# Patient Record
Sex: Male | Born: 1985 | Race: White | Hispanic: No | Marital: Single | Smoking: Current every day smoker
Health system: Southern US, Community
[De-identification: ages and names within clinical notes are randomized; demographics above are authoritative.]

## PROBLEM LIST (undated history)

## (undated) DIAGNOSIS — R569 Unspecified convulsions: Secondary | ICD-10-CM

## (undated) DIAGNOSIS — J45909 Unspecified asthma, uncomplicated: Secondary | ICD-10-CM

## (undated) DIAGNOSIS — R011 Cardiac murmur, unspecified: Secondary | ICD-10-CM

## (undated) DIAGNOSIS — G43909 Migraine, unspecified, not intractable, without status migrainosus: Secondary | ICD-10-CM

---

## 2015-06-25 ENCOUNTER — Encounter (HOSPITAL_BASED_OUTPATIENT_CLINIC_OR_DEPARTMENT_OTHER): Payer: Self-pay

## 2015-06-25 ENCOUNTER — Emergency Department (HOSPITAL_BASED_OUTPATIENT_CLINIC_OR_DEPARTMENT_OTHER)
Admission: EM | Admit: 2015-06-25 | Discharge: 2015-06-25 | Disposition: A | Discharge status: Home / Self Care | Payer: Self-pay | Attending: Emergency Medicine | Admitting: Emergency Medicine

## 2015-06-25 ENCOUNTER — Emergency Department (HOSPITAL_BASED_OUTPATIENT_CLINIC_OR_DEPARTMENT_OTHER): Payer: Self-pay

## 2015-06-25 DIAGNOSIS — J45901 Unspecified asthma with (acute) exacerbation: Secondary | ICD-10-CM | POA: Insufficient documentation

## 2015-06-25 DIAGNOSIS — Z8679 Personal history of other diseases of the circulatory system: Secondary | ICD-10-CM | POA: Insufficient documentation

## 2015-06-25 DIAGNOSIS — F17200 Nicotine dependence, unspecified, uncomplicated: Secondary | ICD-10-CM | POA: Insufficient documentation

## 2015-06-25 DIAGNOSIS — R0789 Other chest pain: Secondary | ICD-10-CM | POA: Insufficient documentation

## 2015-06-25 DIAGNOSIS — R011 Cardiac murmur, unspecified: Secondary | ICD-10-CM | POA: Insufficient documentation

## 2015-06-25 HISTORY — DX: Unspecified convulsions: R56.9

## 2015-06-25 HISTORY — DX: Cardiac murmur, unspecified: R01.1

## 2015-06-25 HISTORY — DX: Unspecified asthma, uncomplicated: J45.909

## 2015-06-25 HISTORY — DX: Migraine, unspecified, not intractable, without status migrainosus: G43.909

## 2015-06-25 NOTE — ED Notes (Signed)
MD at bedside. 

## 2015-06-25 NOTE — ED Provider Notes (Signed)
CSN: [id number]     Arrival date & time 06/25/15  1236 History   First MD Initiated Contact with Patient 06/25/15 1345     Chief Complaint  Patient presents with  . Chest Pain     Patient is a 30 y.o. male presenting with chest pain. The history is provided by the patient. No language interpreter was used.  Chest Pain  Gwyneth RevelsJustin Scarpelli is a 30 y.o. male who presents to the Emergency Department complaining of chest pain.  He reports 2 weeks of constant left upper chest/shoulder pain that is described as a discomfort. He also has shortness of breath was tying his shoes and activity. He has a history of asthma and is a smoker. He did use cocaine 7 or 8 months ago and none recently. Is any fever, cough, abdominal pain, vomiting, leg swelling. His father has a history of cardiac disease in his mid 5660s. 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 Symptoms are moderate, constant.  Past Medical History  Diagnosis Date  . Asthma   . Heart murmur   . Seizures (HCC)   . Migraine    History reviewed. No pertinent past surgical history. No family history on file. Social History  Substance Use Topics  . Smoking status: Current Every Day Smoker  . Smokeless tobacco: None  . Alcohol Use: Yes     Comment: occ    Review of Systems  Cardiovascular: Positive for chest pain.  All other systems reviewed and are negative.     Allergies  Review of patient's allergies indicates no known allergies.  Home Medications   Prior to Admission medications   Not on File   BP 122/77 mmHg  Pulse 88  Temp(Src) 98.6 F (37 C) (Oral)  Resp 20  Ht 6\' 2"  (1.88 m)  Wt 230 lb (104.327 kg)  BMI 29.52 kg/m2  SpO2 100% Physical Exam  Constitutional: He is oriented to person, place, and time. He appears well-developed and well-nourished.  HENT:  Head: Normocephalic and atraumatic.  Cardiovascular: Normal rate and regular rhythm.   No murmur heard. Pulmonary/Chest: Effort normal and breath sounds normal. No  respiratory distress.  Left upper chest wall tenderness to palpation.    Abdominal: Soft. There is no tenderness. There is no rebound and no guarding.  Musculoskeletal: He exhibits no edema or tenderness.  Neurological: He is alert and oriented to person, place, and time.  Skin: Skin is warm and dry.  Psychiatric: He has a normal mood and affect. His behavior is normal.  Nursing note and vitals reviewed.   ED Course  Procedures (including critical care time) Labs Review Labs Reviewed - No data to display  Imaging Review Dg Chest 2 View  06/25/2015  CLINICAL DATA:  Chest pain for 2 weeks EXAM: CHEST  2 VIEW COMPARISON:  None. FINDINGS: Lungs are clear. Heart size and pulmonary vascularity are normal. No adenopathy. No bone lesions. No pneumothorax. IMPRESSION: No edema or consolidation. Electronically Signed   By: Bretta BangWilliam  Woodruff III M.D.   On: 06/25/2015 14:19   I have personally reviewed and evaluated these images and lab results as part of my medical decision-making.   EKG Interpretation   Date/Time:  Friday June 25 2015 12:45:07 EDT Ventricular Rate:  54 PR Interval:  178 QRS Duration: 100 QT Interval:  402 QTC Calculation: 381 R Axis:   87 Text Interpretation:  Sinus bradycardia Otherwise normal ECG Confirmed by  Lincoln Brighamees, Liz [phone]([id number]) on 06/25/2015 1:09:50 PM Also confirmed  by Lincoln Brigham  [phone]), editor Dan Humphreys, CCT, SANDRA [phone])  on 06/25/2015 1:49:43 PM      MDM   Final diagnoses:  Chest wall pain  Patient here for evaluation of 2 weeks of constant left-sided chest pain at this reproducible on examination. Presentation is not consistent with ACS, PE, dissection, pericarditis. Offered patient reassurance and discussed home care for muscular skeletal chest pain with rest, NSAIDs, Ice/Heat. Discussed outpatient follow-up and return precautions.    Tilden Fossa, MD 06/26/15 [phone]

## 2015-06-25 NOTE — ED Notes (Signed)
C/o chest pressure x 2 weeks-hx of cocaine use-none x 8 months-NAD-steady gait

## 2015-06-25 NOTE — Discharge Instructions (Signed)
You can take ibuprofen, available over the counter as needed for chest pain.     Chest Wall Pain Chest wall pain is pain in or around the bones and muscles of your chest. Sometimes, an injury causes this pain. Sometimes, the cause may not be known. This pain may take several weeks or longer to get better. HOME CARE INSTRUCTIONS  Pay attention to any changes in your symptoms. Take these actions to help with your pain:   Rest as told by your health care provider.   Avoid activities that cause pain. These include any activities that use your chest muscles or your abdominal and side muscles to lift heavy items.   If directed, apply ice to the painful area:  Put ice in a plastic bag.  Place a towel between your skin and the bag.  Leave the ice on for 20 minutes, 2-3 times per day.  Take over-the-counter and prescription medicines only as told by your health care provider.  Do not use tobacco products, including cigarettes, chewing tobacco, and e-cigarettes. If you need help quitting, ask your health care provider.  Keep all follow-up visits as told by your health care provider. This is important. SEEK MEDICAL CARE IF:  You have a fever.  Your chest pain becomes worse.  You have new symptoms. SEEK IMMEDIATE MEDICAL CARE IF:  You have nausea or vomiting.  You feel sweaty or light-headed.  You have a cough with phlegm (sputum) or you cough up blood.  You develop shortness of breath.   This information is not intended to replace advice given to you by your health care provider. Make sure you discuss any questions you have with your health care provider.   Document Released: 02/27/2005 Document Revised: 11/18/2014 Document Reviewed: 05/25/2014 Elsevier Interactive Patient Education 2016 [employer]. Smoking Cessation, Tips for Success If you are ready to quit smoking, congratulations! You have chosen to help yourself be healthier. Cigarettes bring nicotine, tar, carbon  monoxide, and other irritants into your body. Your lungs, heart, and blood vessels will be able to work better without these poisons. There are many different ways to quit smoking. Nicotine gum, nicotine patches, a nicotine inhaler, or nicotine nasal spray can help with physical craving. Hypnosis, support groups, and medicines help break the habit of smoking. WHAT THINGS CAN I DO TO MAKE QUITTING EASIER?  Here are some tips to help you quit for good:  Pick a date when you will quit smoking completely. Tell all of your friends and family about your plan to quit on that date.  Do not try to slowly cut down on the number of cigarettes you are smoking. Pick a quit date and quit smoking completely starting on that day.  Throw away all cigarettes.   Clean and remove all ashtrays from your home, work, and car.  On a card, write down your reasons for quitting. Carry the card with you and read it when you get the urge to smoke.  Cleanse your body of nicotine. Drink enough water and fluids to keep your urine clear or pale yellow. Do this after quitting to flush the nicotine from your body.  Learn to predict your moods. Do not let a bad situation be your excuse to have a cigarette. Some situations in your life might tempt you into wanting a cigarette.  Never have "just one" cigarette. It leads to wanting another and another. Remind yourself of your decision to quit.  Change habits associated with smoking. If you smoked  while driving or when feeling stressed, try other activities to replace smoking. Stand up when drinking your coffee. Brush your teeth after eating. Sit in a different chair when you read the paper. Avoid alcohol while trying to quit, and try to drink fewer caffeinated beverages. Alcohol and caffeine may urge you to smoke.  Avoid foods and drinks that can trigger a desire to smoke, such as sugary or spicy foods and alcohol.  Ask people who smoke not to smoke around you.  Have something  planned to do right after eating or having a cup of coffee. For example, plan to take a walk or exercise.  Try a relaxation exercise to calm you down and decrease your stress. Remember, you may be tense and nervous for the first 2 weeks after you quit, but this will pass.  Find new activities to keep your hands busy. Play with a pen, coin, or rubber band. Doodle or draw things on paper.  Brush your teeth right after eating. This will help cut down on the craving for the taste of tobacco after meals. You can also try mouthwash.   Use oral substitutes in place of cigarettes. Try using lemon drops, carrots, cinnamon sticks, or chewing gum. Keep them handy so they are available when you have the urge to smoke.  When you have the urge to smoke, try deep breathing.  Designate your home as a nonsmoking area.  If you are a heavy smoker, ask your health care provider about a prescription for nicotine chewing gum. It can ease your withdrawal from nicotine.  Reward yourself. Set aside the cigarette money you save and buy yourself something nice.  Look for support from others. Join a support group or smoking cessation program. Ask someone at home or at work to help you with your plan to quit smoking.  Always ask yourself, "Do I need this cigarette or is this just a reflex?" Tell yourself, "Today, I choose not to smoke," or "I do not want to smoke." You are reminding yourself of your decision to quit.  Do not replace cigarette smoking with electronic cigarettes (commonly called e-cigarettes). The safety of e-cigarettes is unknown, and some may contain harmful chemicals.  If you relapse, do not give up! Plan ahead and think about what you will do the next time you get the urge to smoke. HOW WILL I FEEL WHEN I QUIT SMOKING? You may have symptoms of withdrawal because your body is used to nicotine (the addictive substance in cigarettes). You may crave cigarettes, be irritable, feel very hungry, cough  often, get headaches, or have difficulty concentrating. The withdrawal symptoms are only temporary. They are strongest when you first quit but will go away within 10-14 days. When withdrawal symptoms occur, stay in control. Think about your reasons for quitting. Remind yourself that these are signs that your body is healing and getting used to being without cigarettes. Remember that withdrawal symptoms are easier to treat than the major diseases that smoking can cause.  Even after the withdrawal is over, expect periodic urges to smoke. However, these cravings are generally short lived and will go away whether you smoke or not. Do not smoke! WHAT RESOURCES ARE AVAILABLE TO HELP ME QUIT SMOKING? Your health care provider can direct you to community resources or hospitals for support, which may include:  Group support.  Education.  Hypnosis.  Therapy.   This information is not intended to replace advice given to you by your health care provider. Make sure  you discuss any questions you have with your health care provider.   Document Released: 11/26/2003 Document Revised: 03/20/2014 Document Reviewed: 08/15/2012 Elsevier Interactive Patient Education [employer]

## 2017-09-02 IMAGING — CR DG CHEST 2V
2 series · 2 of 2 positions shown · non-contrast
Comparison: None.

CLINICAL DATA: Chest pain for 2 weeks

EXAM:
CHEST  2 VIEW

[w chest pa]
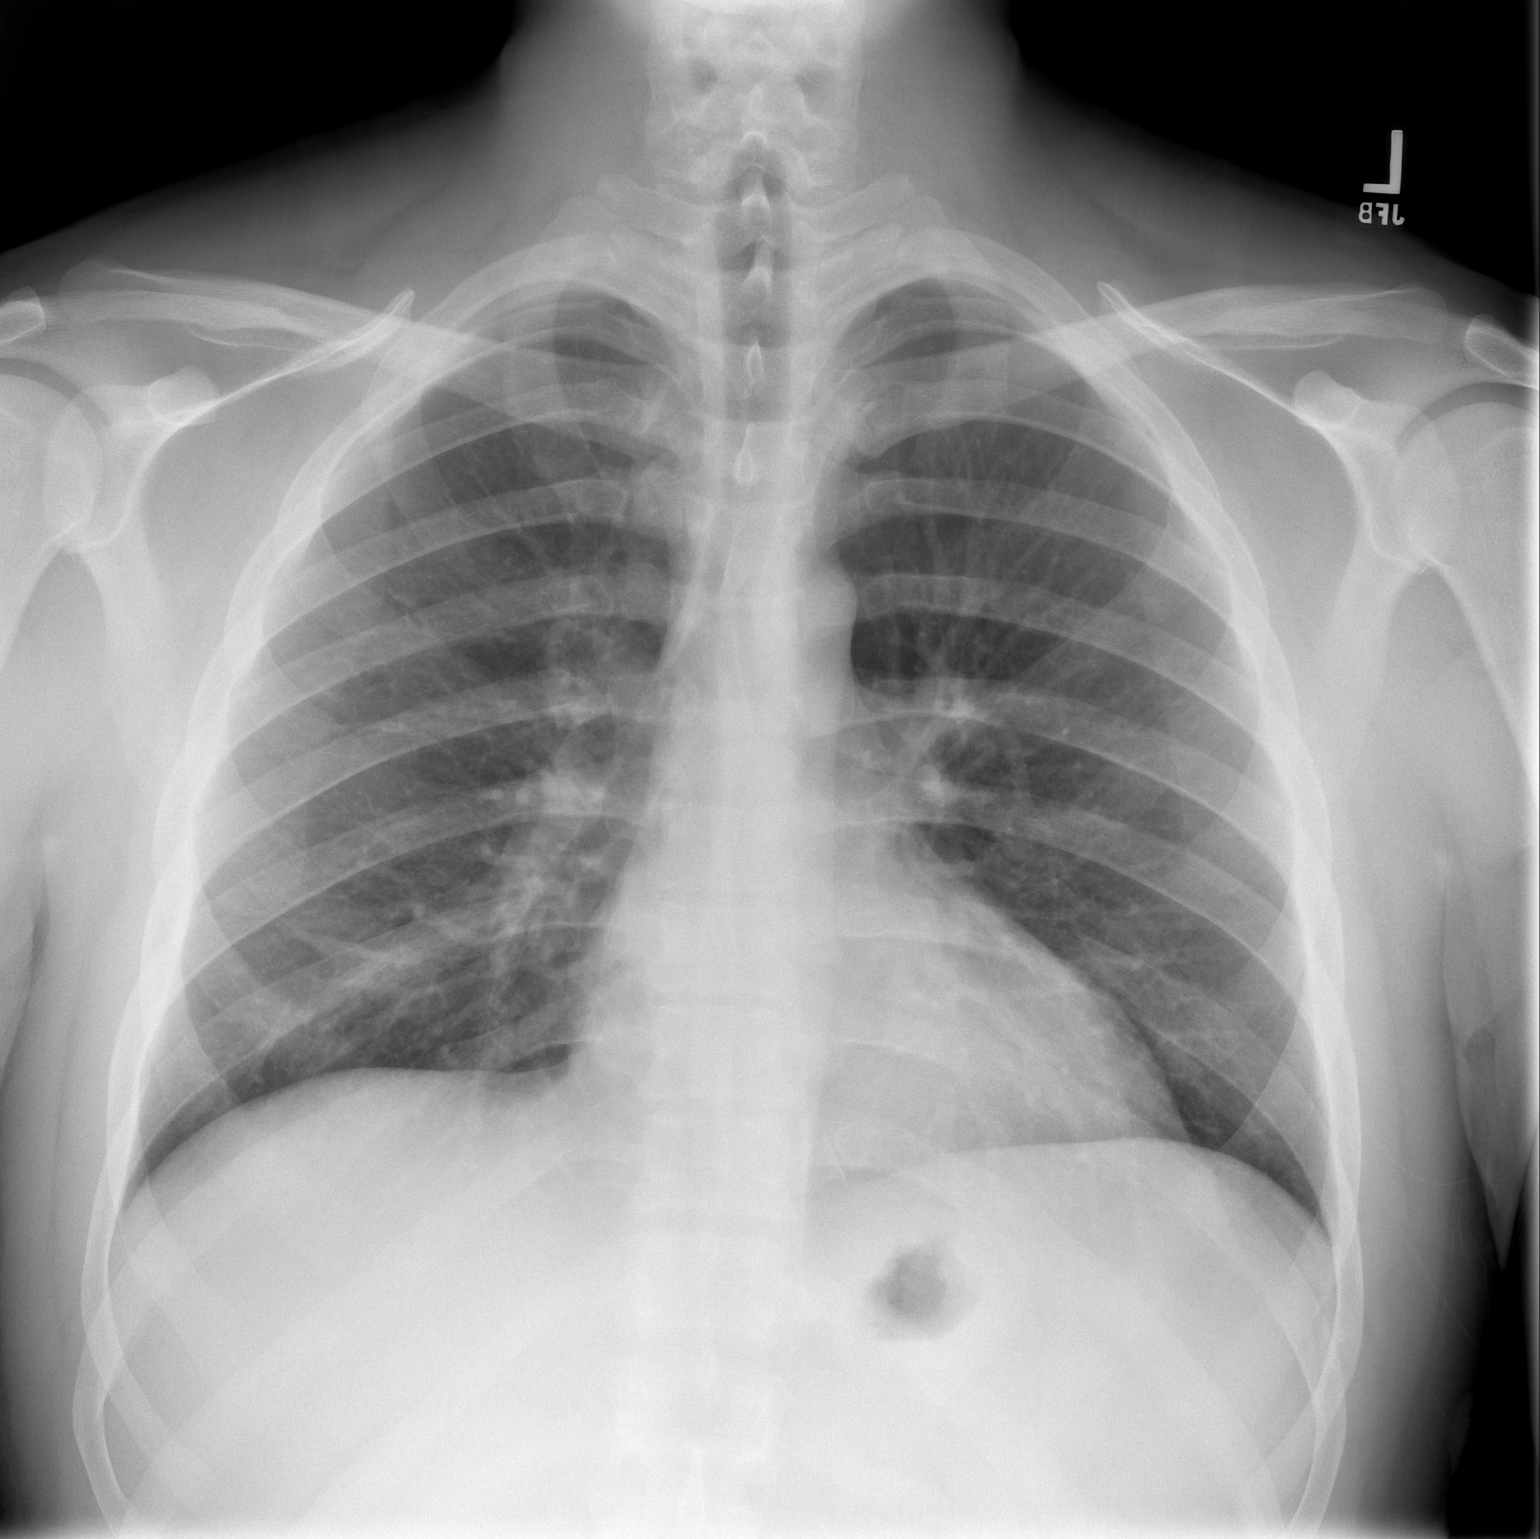

[w chest lat]
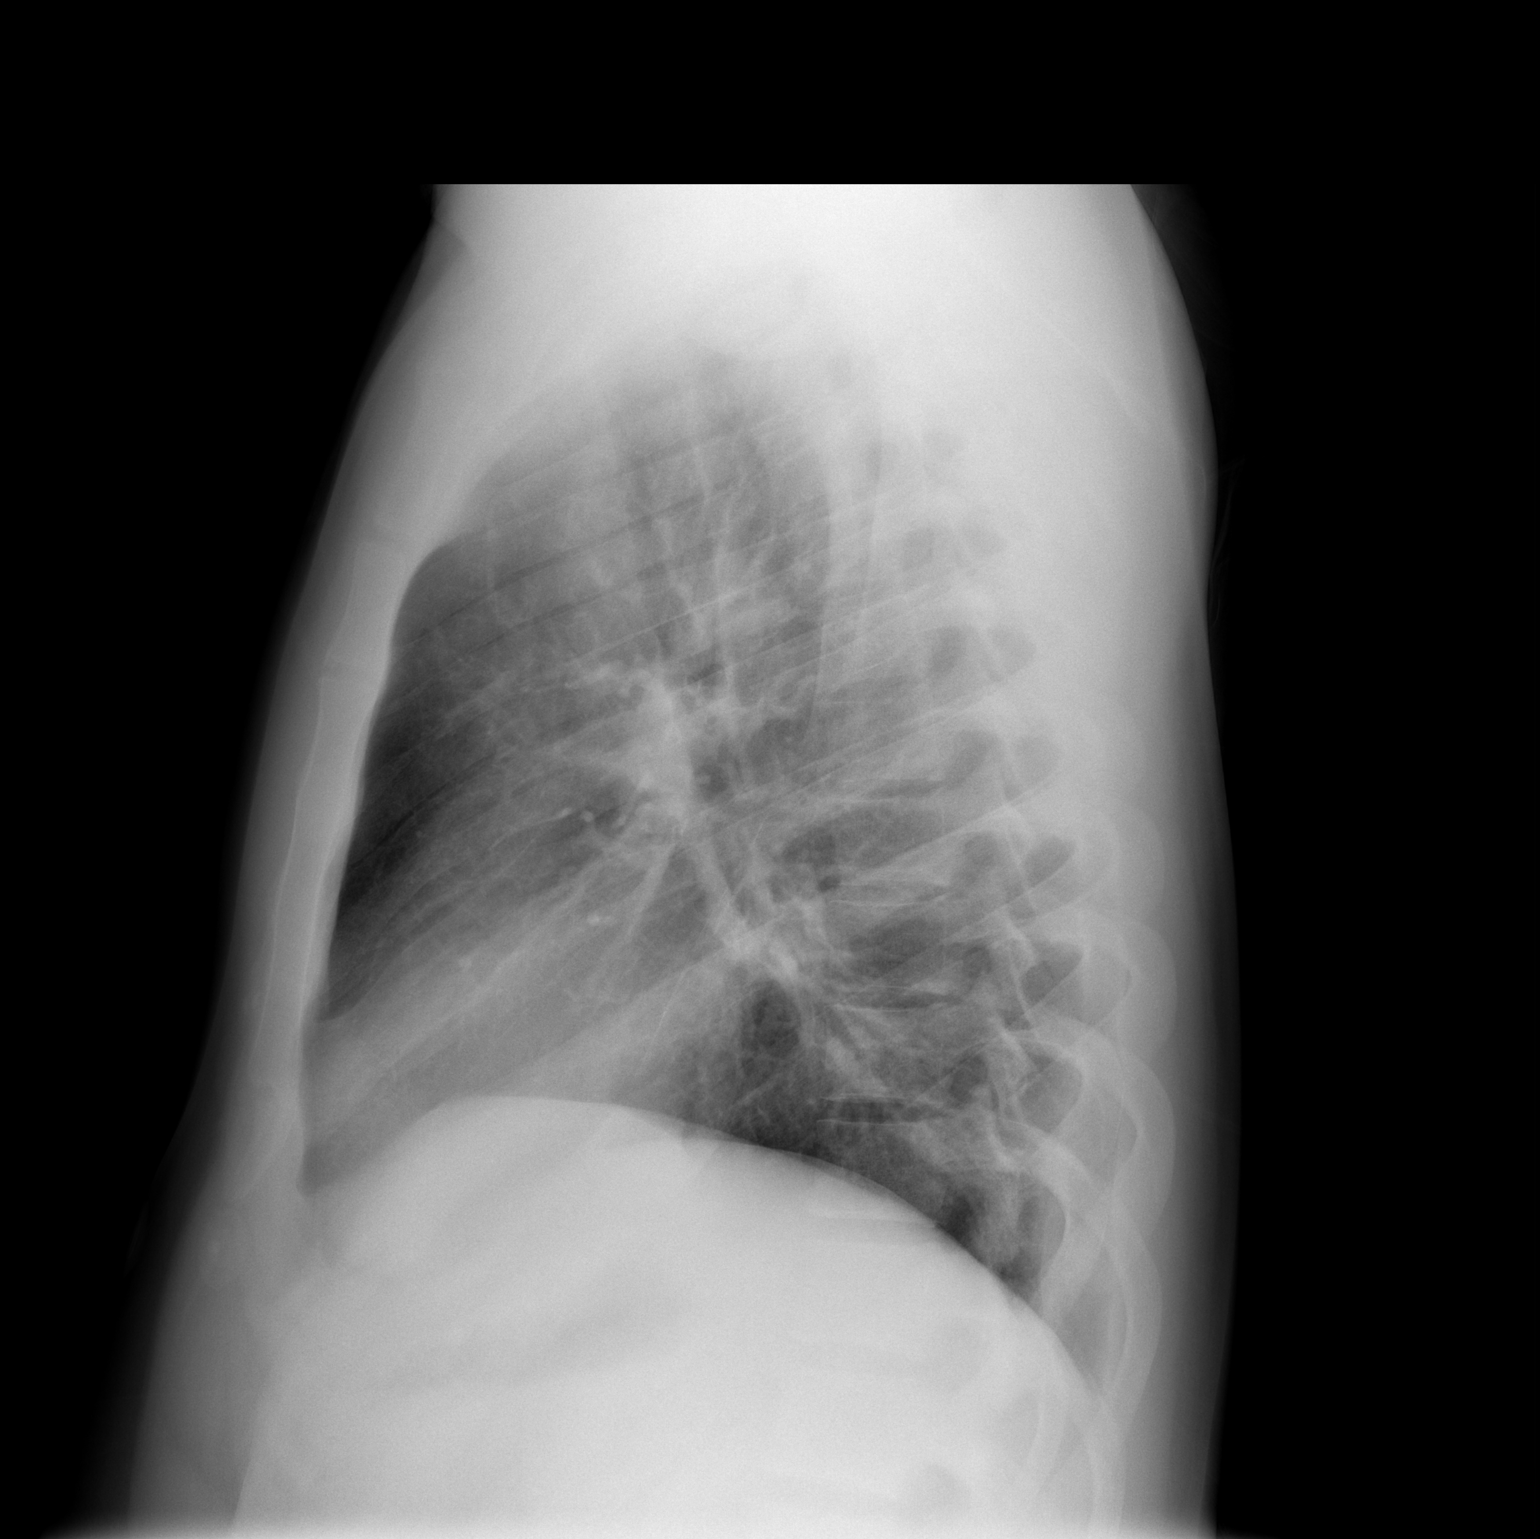

[2 of 2 positions shown; findings below may reference images not displayed]

FINDINGS: Lungs are clear. Heart size and pulmonary vascularity are normal. No
adenopathy. No bone lesions. No pneumothorax.
IMPRESSION: No edema or consolidation.
